# Patient Record
Sex: Male | Born: 2000 | Race: Black or African American | Hispanic: No | Marital: Single | Smoking: Never smoker
Health system: Southern US, Community
[De-identification: ages and names within clinical notes are randomized; demographics above are authoritative.]

## PROBLEM LIST (undated history)

## (undated) DIAGNOSIS — K219 Gastro-esophageal reflux disease without esophagitis: Secondary | ICD-10-CM

## (undated) HISTORY — DX: Gastro-esophageal reflux disease without esophagitis: K21.9

---
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
## 2022-05-17 DIAGNOSIS — U071 COVID-19: Secondary | ICD-10-CM | POA: Diagnosis not present

## 2022-05-17 DIAGNOSIS — Z20822 Contact with and (suspected) exposure to covid-19: Secondary | ICD-10-CM | POA: Diagnosis not present

## 2023-05-28 DIAGNOSIS — S29012A Strain of muscle and tendon of back wall of thorax, initial encounter: Secondary | ICD-10-CM | POA: Diagnosis not present

## 2023-05-28 DIAGNOSIS — M62830 Muscle spasm of back: Secondary | ICD-10-CM | POA: Diagnosis not present

## 2023-07-05 DIAGNOSIS — B353 Tinea pedis: Secondary | ICD-10-CM | POA: Diagnosis not present

## 2023-11-18 DIAGNOSIS — J157 Pneumonia due to Mycoplasma pneumoniae: Secondary | ICD-10-CM | POA: Diagnosis not present

## 2023-11-18 DIAGNOSIS — R070 Pain in throat: Secondary | ICD-10-CM | POA: Diagnosis not present

## 2023-12-13 DIAGNOSIS — Z113 Encounter for screening for infections with a predominantly sexual mode of transmission: Secondary | ICD-10-CM | POA: Diagnosis not present

## 2023-12-20 DIAGNOSIS — Z13220 Encounter for screening for lipoid disorders: Secondary | ICD-10-CM | POA: Diagnosis not present

## 2023-12-20 DIAGNOSIS — Z0000 Encounter for general adult medical examination without abnormal findings: Secondary | ICD-10-CM | POA: Diagnosis not present

## 2024-02-27 DIAGNOSIS — J029 Acute pharyngitis, unspecified: Secondary | ICD-10-CM | POA: Diagnosis not present

## 2024-02-27 DIAGNOSIS — U071 COVID-19: Secondary | ICD-10-CM | POA: Diagnosis not present

## 2024-02-27 DIAGNOSIS — R509 Fever, unspecified: Secondary | ICD-10-CM | POA: Diagnosis not present
